# Patient Record
Sex: Female | Born: 1995
Health system: Southern US, Community
[De-identification: ages and names within clinical notes are randomized; demographics above are authoritative.]

## PROBLEM LIST (undated history)

## (undated) DIAGNOSIS — F419 Anxiety disorder, unspecified: Secondary | ICD-10-CM

## (undated) DIAGNOSIS — F32A Depression, unspecified: Secondary | ICD-10-CM

## (undated) HISTORY — DX: Anxiety disorder, unspecified: F41.9

## (undated) HISTORY — PX: WISDOM TOOTH EXTRACTION: SHX21

## (undated) HISTORY — PX: TONSILLECTOMY: SUR1361

## (undated) HISTORY — PX: FOOT SURGERY: SHX648

## (undated) HISTORY — DX: Depression, unspecified: F32.A

## (undated) HISTORY — PX: CYST REMOVAL: SHX22

---
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
## 2014-09-25 ENCOUNTER — Emergency Department (HOSPITAL_COMMUNITY)
Admission: EM | Admit: 2014-09-25 | Discharge: 2014-09-26 | Disposition: A | Discharge status: Home / Self Care | Payer: PRIVATE HEALTH INSURANCE | Attending: Emergency Medicine | Admitting: Emergency Medicine

## 2014-09-25 ENCOUNTER — Encounter (HOSPITAL_COMMUNITY): Payer: Self-pay

## 2014-09-25 DIAGNOSIS — R112 Nausea with vomiting, unspecified: Secondary | ICD-10-CM | POA: Diagnosis present

## 2014-09-25 DIAGNOSIS — R197 Diarrhea, unspecified: Secondary | ICD-10-CM | POA: Diagnosis not present

## 2014-09-25 DIAGNOSIS — Z3202 Encounter for pregnancy test, result negative: Secondary | ICD-10-CM | POA: Diagnosis not present

## 2014-09-25 DIAGNOSIS — Z79899 Other long term (current) drug therapy: Secondary | ICD-10-CM | POA: Insufficient documentation

## 2014-09-25 LAB — CBC WITH DIFFERENTIAL/PLATELET
Basophils Absolute: 0 10*3/uL (ref 0.0–0.1)
Basophils Relative: 0 % (ref 0–1)
Eosinophils Absolute: 0.2 10*3/uL (ref 0.0–0.7)
Eosinophils Relative: 1 % (ref 0–5)
HCT: 44.1 % (ref 36.0–46.0)
Hemoglobin: 14.9 g/dL (ref 12.0–15.0)
Lymphocytes Relative: 4 % — ABNORMAL LOW (ref 12–46)
Lymphs Abs: 0.5 10*3/uL — ABNORMAL LOW (ref 0.7–4.0)
MCH: 31.2 pg (ref 26.0–34.0)
MCHC: 33.8 g/dL (ref 30.0–36.0)
MCV: 92.3 fL (ref 78.0–100.0)
Monocytes Absolute: 0.7 10*3/uL (ref 0.1–1.0)
Monocytes Relative: 5 % (ref 3–12)
Neutro Abs: 11.3 10*3/uL — ABNORMAL HIGH (ref 1.7–7.7)
Neutrophils Relative %: 90 % — ABNORMAL HIGH (ref 43–77)
Platelets: 335 10*3/uL (ref 150–400)
RBC: 4.78 MIL/uL (ref 3.87–5.11)
RDW: 12.3 % (ref 11.5–15.5)
WBC: 12.6 10*3/uL — ABNORMAL HIGH (ref 4.0–10.5)

## 2014-09-25 MED ORDER — HYDROMORPHONE HCL 1 MG/ML IJ SOLN
0.5000 mg | Freq: Once | INTRAMUSCULAR | Status: AC
  Administered 2014-09-25: 0.5 mg via INTRAVENOUS
  Filled 2014-09-25: qty 1

## 2014-09-25 MED ORDER — ONDANSETRON HCL 4 MG/2ML IJ SOLN
4.0000 mg | Freq: Once | INTRAMUSCULAR | Status: AC
  Administered 2014-09-25: 4 mg via INTRAVENOUS
  Filled 2014-09-25: qty 2

## 2014-09-25 MED ORDER — SODIUM CHLORIDE 0.9 % IV BOLUS (SEPSIS)
2000.0000 mL | Freq: Once | INTRAVENOUS | Status: AC
  Administered 2014-09-25: 2000 mL via INTRAVENOUS

## 2014-09-25 MED ORDER — KETOROLAC TROMETHAMINE 15 MG/ML IJ SOLN
15.0000 mg | Freq: Once | INTRAMUSCULAR | Status: AC
  Administered 2014-09-25: 15 mg via INTRAVENOUS
  Filled 2014-09-25: qty 1

## 2014-09-25 NOTE — ED Notes (Signed)
Pt presents with c/o abdominal pain, vomiting, and diarrhea. Pt reports her stomach has been hurting since November and she has been following a doctor for this but has been unable to come out with a definitive diagnosis. Pt reports the vomiting and diarrhea started today. Pt also c/o headache.

## 2014-09-26 LAB — URINALYSIS, ROUTINE W REFLEX MICROSCOPIC
Bilirubin Urine: NEGATIVE
Glucose, UA: NEGATIVE mg/dL
Ketones, ur: NEGATIVE mg/dL
Leukocytes, UA: NEGATIVE
Nitrite: NEGATIVE
Protein, ur: NEGATIVE mg/dL
Specific Gravity, Urine: 1.022 (ref 1.005–1.030)
Urobilinogen, UA: 0.2 mg/dL (ref 0.0–1.0)
pH: 6 (ref 5.0–8.0)

## 2014-09-26 LAB — COMPREHENSIVE METABOLIC PANEL
ALT: 26 U/L (ref 0–35)
AST: 28 U/L (ref 0–37)
Albumin: 4.3 g/dL (ref 3.5–5.2)
Alkaline Phosphatase: 86 U/L (ref 39–117)
Anion gap: 11 (ref 5–15)
BUN: 15 mg/dL (ref 6–23)
CO2: 23 mmol/L (ref 19–32)
Calcium: 8.9 mg/dL (ref 8.4–10.5)
Chloride: 101 mmol/L (ref 96–112)
Creatinine, Ser: 0.77 mg/dL (ref 0.50–1.10)
GFR calc Af Amer: >90 mL/min (ref >90)
GFR calc non Af Amer: >90 mL/min (ref >90)
Glucose, Bld: 109 mg/dL — ABNORMAL HIGH (ref 70–99)
Potassium: 3.4 mmol/L — ABNORMAL LOW (ref 3.5–5.1)
Sodium: 135 mmol/L (ref 135–145)
Total Bilirubin: 0.4 mg/dL (ref 0.3–1.2)
Total Protein: 7.2 g/dL (ref 6.0–8.3)

## 2014-09-26 LAB — URINE MICROSCOPIC-ADD ON

## 2014-09-26 LAB — LIPASE, BLOOD: Lipase: 20 U/L (ref 11–59)

## 2014-09-26 LAB — PREGNANCY, URINE: Preg Test, Ur: NEGATIVE

## 2014-09-26 MED ORDER — ONDANSETRON HCL 4 MG PO TABS: 4.0000 mg | ORAL_TABLET | Freq: Four times a day (QID) | ORAL | Status: AC

## 2014-09-26 NOTE — ED Provider Notes (Signed)
Nursing notes and vitals signs, including pulse oximetry, reviewed.  Summary of this visit's results, reviewed by myself:  Labs:  Results for orders placed or performed during the hospital encounter of 09/25/14 (from the past 24 hour(s))  Comprehensive metabolic panel     Status: Abnormal   Collection Time: 09/25/14 10:13 PM  Result Value Ref Range   Sodium 135 135 - 145 mmol/L   Potassium 3.4 (L) 3.5 - 5.1 mmol/L   Chloride 101 96 - 112 mmol/L   CO2 23 19 - 32 mmol/L   Glucose, Bld 109 (H) 70 - 99 mg/dL   BUN 15 6 - 23 mg/dL   Creatinine, Ser 0.98 0.50 - 1.10 mg/dL   Calcium 8.9 8.4 - 11.9 mg/dL   Total Protein 7.2 6.0 - 8.3 g/dL   Albumin 4.3 3.5 - 5.2 g/dL   AST 28 0 - 37 U/L   ALT 26 0 - 35 U/L   Alkaline Phosphatase 86 39 - 117 U/L   Total Bilirubin 0.4 0.3 - 1.2 mg/dL   GFR calc non Af Amer >90 >90 mL/min   GFR calc Af Amer >90 >90 mL/min   Anion gap 11 5 - 15  Lipase, blood     Status: None   Collection Time: 09/25/14 10:13 PM  Result Value Ref Range   Lipase 20 11 - 59 U/L  CBC with Differential     Status: Abnormal   Collection Time: 09/25/14 10:13 PM  Result Value Ref Range   WBC 12.6 (H) 4.0 - 10.5 K/uL   RBC 4.78 3.87 - 5.11 MIL/uL   Hemoglobin 14.9 12.0 - 15.0 g/dL   HCT 14.7 82.9 - 56.2 %   MCV 92.3 78.0 - 100.0 fL   MCH 31.2 26.0 - 34.0 pg   MCHC 33.8 30.0 - 36.0 g/dL   RDW 13.0 86.5 - 78.4 %   Platelets 335 150 - 400 K/uL   Neutrophils Relative % 90 (H) 43 - 77 %   Neutro Abs 11.3 (H) 1.7 - 7.7 K/uL   Lymphocytes Relative 4 (L) 12 - 46 %   Lymphs Abs 0.5 (L) 0.7 - 4.0 K/uL   Monocytes Relative 5 3 - 12 %   Monocytes Absolute 0.7 0.1 - 1.0 K/uL   Eosinophils Relative 1 0 - 5 %   Eosinophils Absolute 0.2 0.0 - 0.7 K/uL   Basophils Relative 0 0 - 1 %   Basophils Absolute 0.0 0.0 - 0.1 K/uL  Urinalysis, Routine w reflex microscopic     Status: Abnormal   Collection Time: 09/25/14 11:17 PM  Result Value Ref Range   Color, Urine YELLOW YELLOW   APPearance CLEAR CLEAR   Specific Gravity, Urine 1.022 1.005 - 1.030   pH 6.0 5.0 - 8.0   Glucose, UA NEGATIVE NEGATIVE mg/dL   Hgb urine dipstick TRACE (A) NEGATIVE   Bilirubin Urine NEGATIVE NEGATIVE   Ketones, ur NEGATIVE NEGATIVE mg/dL   Protein, ur NEGATIVE NEGATIVE mg/dL   Urobilinogen, UA 0.2 0.0 - 1.0 mg/dL   Nitrite NEGATIVE NEGATIVE   Leukocytes, UA NEGATIVE NEGATIVE  Pregnancy, urine     Status: None   Collection Time: 09/25/14 11:17 PM  Result Value Ref Range   Preg Test, Ur NEGATIVE NEGATIVE  Urine microscopic-add on     Status: Abnormal   Collection Time: 09/25/14 11:17 PM  Result Value Ref Range   Squamous Epithelial / LPF FEW (A) RARE   WBC, UA 3-6 <3 WBC/hpf   RBC /  HPF 0-2 <3 RBC/hpf   Bacteria, UA FEW (A) RARE   Urine-Other MUCOUS PRESENT      Paula LibraJohn Theadore Blunck, MD 09/26/14 [phone]

## 2014-09-26 NOTE — ED Notes (Signed)
AVS explained in detail. Highly advised to follow up with GI MD. No other c/c.

## 2014-09-26 NOTE — ED Provider Notes (Signed)
CSN: [id number]     Arrival date & time 09/25/14  2122 History   First MD Initiated Contact with Patient 09/25/14 2147     Chief Complaint  Patient presents with  . Abdominal Pain  . Emesis  . Diarrhea     (Consider location/radiation/quality/duration/timing/severity/associated sxs/prior Treatment) HPI   19 year old female with abdominal pain. Additional history provided by parents at bedside. Patient reports ongoing abdominal pain since this past November. Intermittent crampy pain and bloating. Often constipated. Previous evaluation by gastroenterology without a clear etiology. Currently taking levsin. She is presenting today because of similar type pain but also now nausea, vomiting diarrhea. No fevers or chills. Nonbloody stool in emesis. No urinary complaints. No sick contacts. No similar past abdominal or pelvic surgical history. Unusual vaginal bleeding or discharge. She does not think she is pregnant.   History reviewed. No pertinent past medical history. Past Surgical History  Procedure Laterality Date  . Foot surgery     Family History  Problem Relation Age of Onset  . Cancer Paternal Grandmother     Brain  . Cancer Paternal Grandfather     Colon   History  Substance Use Topics  . Smoking status: Never Smoker   . Smokeless tobacco: Never Used  . Alcohol Use: No   OB History    No data available     Review of Systems  All systems reviewed and negative, other than as noted in HPI.   Allergies  Review of patient's allergies indicates no known allergies.  Home Medications   Prior to Admission medications   Medication Sig Start Date End Date Taking? Authorizing Provider  busPIRone (BUSPAR) 15 MG tablet Take 15 mg by mouth 2 (two) times daily.  07/01/14  Yes Historical Provider, MD  fluvoxaMINE (LUVOX) 50 MG tablet Take 50-75 mg by mouth 2 (two) times daily. 50 in the ma and 75 mg qhs 09/03/14  Yes Historical Provider, MD  hyoscyamine (LEVSIN SL) 0.125 MG SL  tablet Take 0.125 mg by mouth every 6 (six) hours as needed for cramping.  07/08/14  Yes Historical Provider, MD  ibuprofen (ADVIL,MOTRIN) 200 MG tablet Take 600 mg by mouth every 6 (six) hours as needed for moderate pain.   Yes Historical Provider, MD   BP 103/62 mmHg  Pulse 107  Temp(Src) 98.8 F (37.1 C) (Oral)  Resp 13  Ht  (1.651 m)  Wt 140 lb (63.504 kg)  BMI 23.30 kg/m2  SpO2 98%  LMP 08/31/2014 Physical Exam  Constitutional: She appears well-developed and well-nourished. No distress.  HENT:  Head: Normocephalic and atraumatic.  Eyes: Conjunctivae are normal. Right eye exhibits no discharge. Left eye exhibits no discharge.  Neck: Neck supple.  Cardiovascular: Normal rate, regular rhythm and normal heart sounds.  Exam reveals no gallop and no friction rub.   No murmur heard. Pulmonary/Chest: Effort normal and breath sounds normal. No respiratory distress.  Abdominal: Soft. She exhibits no distension. There is no tenderness.  Musculoskeletal: She exhibits no edema or tenderness.  Neurological: She is alert.  Skin: Skin is warm and dry.  Psychiatric: She has a normal mood and affect. Her behavior is normal. Thought content normal.  Nursing note and vitals reviewed.   ED Course  Procedures (including critical care time) Labs Review Labs Reviewed  CBC WITH DIFFERENTIAL/PLATELET - Abnormal; Notable for the following:    WBC 12.6 (*)    Neutrophils Relative % 90 (*)    Neutro Abs 11.3 (*)    Lymphocytes  Relative 4 (*)    Lymphs Abs 0.5 (*)    All other components within normal limits  PREGNANCY, URINE  COMPREHENSIVE METABOLIC PANEL  LIPASE, BLOOD  URINALYSIS, ROUTINE W REFLEX MICROSCOPIC    Imaging Review No results found.   EKG Interpretation None      MDM   Final diagnoses:  Nausea vomiting and diarrhea    19 year old female with abdominal pain, nausea, vomiting and diarrhea. Suspect for gastroenteritis. Abdominal exam is fairly benign. Patient family  concerned about ongoing abdominal symptoms. Explained that I realistically cannot give exact diagnosis in the emergency room. She presented tachycardic in the 120s. IV was placed. Fluids. Symptomatically treatment. Basic labs and urinalysis.  Patient rechecked. No further complaints. She reports that she feels significantly improved. Heart rate has decreased. Labs still pending at this time.    Raeford RazorStephen Latishia Suitt, MD 09/28/14 [phone]

## 2016-05-21 ENCOUNTER — Emergency Department (HOSPITAL_BASED_OUTPATIENT_CLINIC_OR_DEPARTMENT_OTHER): Payer: PRIVATE HEALTH INSURANCE

## 2016-05-21 ENCOUNTER — Emergency Department (HOSPITAL_BASED_OUTPATIENT_CLINIC_OR_DEPARTMENT_OTHER)
Admission: EM | Admit: 2016-05-21 | Discharge: 2016-05-21 | Disposition: A | Discharge status: Home / Self Care | Payer: PRIVATE HEALTH INSURANCE | Attending: Emergency Medicine | Admitting: Emergency Medicine

## 2016-05-21 ENCOUNTER — Encounter (HOSPITAL_BASED_OUTPATIENT_CLINIC_OR_DEPARTMENT_OTHER): Payer: Self-pay | Admitting: *Deleted

## 2016-05-21 DIAGNOSIS — R197 Diarrhea, unspecified: Secondary | ICD-10-CM | POA: Insufficient documentation

## 2016-05-21 DIAGNOSIS — R1011 Right upper quadrant pain: Secondary | ICD-10-CM

## 2016-05-21 DIAGNOSIS — Z791 Long term (current) use of non-steroidal anti-inflammatories (NSAID): Secondary | ICD-10-CM | POA: Diagnosis not present

## 2016-05-21 DIAGNOSIS — Z79899 Other long term (current) drug therapy: Secondary | ICD-10-CM | POA: Diagnosis not present

## 2016-05-21 DIAGNOSIS — R112 Nausea with vomiting, unspecified: Secondary | ICD-10-CM

## 2016-05-21 DIAGNOSIS — R109 Unspecified abdominal pain: Secondary | ICD-10-CM

## 2016-05-21 LAB — COMPREHENSIVE METABOLIC PANEL
ALBUMIN: 4.5 g/dL (ref 3.5–5.0)
ALK PHOS: 74 U/L (ref 38–126)
ALT: 17 U/L (ref 14–54)
AST: 23 U/L (ref 15–41)
Anion gap: 9 (ref 5–15)
BILIRUBIN TOTAL: 0.4 mg/dL (ref 0.3–1.2)
BUN: 14 mg/dL (ref 6–20)
CO2: 24 mmol/L (ref 22–32)
Calcium: 9.3 mg/dL (ref 8.9–10.3)
Chloride: 105 mmol/L (ref 101–111)
Creatinine, Ser: 0.64 mg/dL (ref 0.44–1.00)
GFR calc Af Amer: >60 mL/min (ref >60)
GFR calc non Af Amer: >60 mL/min (ref >60)
GLUCOSE: 86 mg/dL (ref 65–99)
POTASSIUM: 4.2 mmol/L (ref 3.5–5.1)
Sodium: 138 mmol/L (ref 135–145)
TOTAL PROTEIN: 7.3 g/dL (ref 6.5–8.1)

## 2016-05-21 LAB — LIPASE, BLOOD: Lipase: 17 U/L (ref 11–51)

## 2016-05-21 LAB — URINALYSIS, ROUTINE W REFLEX MICROSCOPIC
Bilirubin Urine: NEGATIVE
GLUCOSE, UA: NEGATIVE mg/dL
Ketones, ur: 15 mg/dL — AB
Leukocytes, UA: NEGATIVE
Nitrite: NEGATIVE
PH: 5.5 (ref 5.0–8.0)
PROTEIN: NEGATIVE mg/dL
Specific Gravity, Urine: 1.019 (ref 1.005–1.030)

## 2016-05-21 LAB — CBC WITH DIFFERENTIAL/PLATELET
BASOS ABS: 0 10*3/uL (ref 0.0–0.1)
BASOS PCT: 0 %
Eosinophils Absolute: 0.1 10*3/uL (ref 0.0–0.7)
Eosinophils Relative: 2 %
HEMATOCRIT: 42.8 % (ref 36.0–46.0)
HEMOGLOBIN: 14.6 g/dL (ref 12.0–15.0)
Lymphocytes Relative: 20 %
Lymphs Abs: 1.2 10*3/uL (ref 0.7–4.0)
MCH: 31.8 pg (ref 26.0–34.0)
MCHC: 34.1 g/dL (ref 30.0–36.0)
MCV: 93.2 fL (ref 78.0–100.0)
Monocytes Absolute: 0.7 10*3/uL (ref 0.1–1.0)
Monocytes Relative: 11 %
NEUTROS ABS: 4.1 10*3/uL (ref 1.7–7.7)
NEUTROS PCT: 67 %
Platelets: 312 10*3/uL (ref 150–400)
RBC: 4.59 MIL/uL (ref 3.87–5.11)
RDW: 12.2 % (ref 11.5–15.5)
WBC: 6.1 10*3/uL (ref 4.0–10.5)

## 2016-05-21 LAB — URINE MICROSCOPIC-ADD ON

## 2016-05-21 LAB — PREGNANCY, URINE: Preg Test, Ur: NEGATIVE

## 2016-05-21 MED ORDER — ONDANSETRON HCL 4 MG/2ML IJ SOLN
4.0000 mg | Freq: Once | INTRAMUSCULAR | Status: AC
  Administered 2016-05-21: 4 mg via INTRAVENOUS
  Filled 2016-05-21: qty 2

## 2016-05-21 MED ORDER — MORPHINE SULFATE (PF) 4 MG/ML IV SOLN
4.0000 mg | Freq: Once | INTRAVENOUS | Status: AC
  Administered 2016-05-21: 4 mg via INTRAVENOUS
  Filled 2016-05-21: qty 1

## 2016-05-21 MED ORDER — SODIUM CHLORIDE 0.9 % IV BOLUS (SEPSIS)
1000.0000 mL | Freq: Once | INTRAVENOUS | Status: AC
  Administered 2016-05-21: 1000 mL via INTRAVENOUS

## 2016-05-21 MED ORDER — ONDANSETRON 4 MG PO TBDP: ORAL_TABLET | ORAL | 0 refills | Status: AC

## 2016-05-21 MED ORDER — DICYCLOMINE HCL 20 MG PO TABS: 20.0000 mg | ORAL_TABLET | Freq: Three times a day (TID) | ORAL | 0 refills | Status: AC | PRN

## 2016-05-21 MED FILL — ONDANSETRON ODT 4 MG TABLET: 4 | 2 days supply | Qty: 9 | Fill #0

## 2016-05-21 MED FILL — DICYCLOMINE 20 MG TABLET: 20 | 7 days supply | Qty: 20 | Fill #0

## 2016-05-21 NOTE — ED Triage Notes (Signed)
Vomiting x 3 days. Abdominal pain and diarrhea. She went to [facility] walk in today and was told to come here for further evaluation and dehydration treatment.

## 2016-05-21 NOTE — ED Notes (Signed)
ED Provider at bedside. 

## 2016-05-21 NOTE — ED Provider Notes (Signed)
[facility]   CSN: [id number] Arrival date & time: 05/21/16  1310     History   Chief Complaint Chief Complaint  Patient presents with  . Emesis  . Abdominal Pain    HPI Brandy Byrd is a 20 y.o. female who presented with vomiting, diarrhea, abdominal pain. Patient states that she has been vomiting for the last 3 days. Also has several episodes of watery diarrhea. Denies any urinary symptoms. Patient has more right upper quadrant pain since yesterday and right flank pain. Patient states that she has a history of kidney stones with similar complaints previously. Denies recent travel or uncooked meat. Patient was sent from urgent care for dehydration for further imaging.   The history is provided by the patient.    History reviewed. No pertinent past medical history.  There are no active problems to display for this patient.   Past Surgical History:  Procedure Laterality Date  . FOOT SURGERY    . WISDOM TOOTH EXTRACTION      OB History    No data available       Home Medications    Prior to Admission medications   Medication Sig Start Date End Date Taking? Authorizing Provider  fluvoxaMINE (LUVOX) 50 MG tablet Take 50-75 mg by mouth 2 (two) times daily. 50 in the ma and 75 mg qhs 09/03/14  Yes Historical Provider, MD  ibuprofen (ADVIL,MOTRIN) 200 MG tablet Take 600 mg by mouth every 6 (six) hours as needed for moderate pain.   Yes Historical Provider, MD  busPIRone (BUSPAR) 15 MG tablet Take 15 mg by mouth 2 (two) times daily.  07/01/14   Historical Provider, MD  hyoscyamine (LEVSIN SL) 0.125 MG SL tablet Take 0.125 mg by mouth every 6 (six) hours as needed for cramping.  07/08/14   Historical Provider, MD  ondansetron (ZOFRAN) 4 MG tablet Take 1 tablet (4 mg total) by mouth every 6 (six) hours. 09/26/14   Raeford RazorStephen Kohut, MD    Family History Family History  Problem Relation Age of Onset  . Cancer Paternal Grandmother     Brain  . Cancer  Paternal Grandfather     Colon    Social History Social History  Substance Use Topics  . Smoking status: Never Smoker  . Smokeless tobacco: Never Used  . Alcohol use No     Allergies   Patient has no known allergies.   Review of Systems Review of Systems  Gastrointestinal: Positive for abdominal pain, diarrhea, nausea and vomiting.  All other systems reviewed and are negative.    Physical Exam Updated Vital Signs BP 117/78   Pulse 78   Temp 97.4 F (36.3 C) (Oral)   Resp 18   Ht 5\' 5"  (1.651 m)   Wt 135 lb (61.2 kg)   LMP 04/04/2016 Comment: menses lasted the month of October  SpO2 100%   BMI 22.47 kg/m   Physical Exam  Constitutional: She is oriented to person, place, and time.  Uncomfortable   HENT:  Head: Normocephalic.  MM slightly dry   Eyes: EOM are normal. Pupils are equal, round, and reactive to light.  Neck: Normal range of motion. Neck supple.  Cardiovascular: Normal rate, regular rhythm and normal heart sounds.   Pulmonary/Chest: Effort normal and breath sounds normal. No respiratory distress. She has no wheezes.  Abdominal: Soft. Bowel sounds are normal.  Mild RUQ tenderness, no murphy's. Mild R CVAT   Musculoskeletal: Normal range of motion.  Neurological: She  is alert and oriented to person, place, and time. No cranial nerve deficit. Coordination normal.  Skin: Skin is warm.  Psychiatric: She has a normal mood and affect.  Nursing note and vitals reviewed.    ED Treatments / Results  Labs (all labs ordered are listed, but only abnormal results are displayed) Labs Reviewed  URINALYSIS, ROUTINE W REFLEX MICROSCOPIC (NOT AT [facility]) - Abnormal; Notable for the following:       Result Value   Hgb urine dipstick SMALL (*)    Ketones, ur 15 (*)    All other components within normal limits  URINE MICROSCOPIC-ADD ON - Abnormal; Notable for the following:    Squamous Epithelial / LPF 0-5 (*)    Bacteria, UA RARE (*)    All other components  within normal limits  CBC WITH DIFFERENTIAL/PLATELET  COMPREHENSIVE METABOLIC PANEL  LIPASE, BLOOD  PREGNANCY, URINE    EKG  EKG Interpretation None       Radiology [place] Abdomen Complete  Result Date: 05/21/2016 CLINICAL DATA:  Three days of right flank pain associated with nausea vomiting and diarrhea. EXAM: ABDOMEN ULTRASOUND COMPLETE COMPARISON:  None in PACs FINDINGS: Gallbladder: No gallstones or wall thickening visualized. No sonographic Murphy sign noted by sonographer. Common bile duct: Diameter: 3.8 mm Liver: No focal lesion identified. Within normal limits in parenchymal echogenicity. IVC: No abnormality visualized. Pancreas: The pancreatic head and body appear normal. The pancreatic tail was partially obscured by bowel gas. The pancreatic duct measures 1.7 mm in diameter. Spleen: Size and appearance within normal limits. Right Kidney: Length: 9.5 cm. Echogenicity within normal limits. No mass or hydronephrosis visualized. No echogenic shadowing stones are observed. Left Kidney: Length: 10 cm. Echogenicity within normal limits. No mass or hydronephrosis visualized. No echogenic shadowing stones are observed. Abdominal aorta: No aneurysm visualized. Other findings: There is no ascites. IMPRESSION: 1. No gallstones or sonographic evidence of acute cholecystitis. If there are clinical concerns of chronic cholecystitis, a nuclear medicine hepatobiliary scan with gallbladder ejection fraction determination may be useful. 2. No kidney stones nor other acute intra-abdominal abnormality is observed. Electronically Signed   By: Thadeus Gandolfi  [place] M.D.   On: 05/21/2016 15:13    Procedures Procedures (including critical care time)  Medications Ordered in ED Medications  sodium chloride 0.9 % bolus 1,000 mL (0 mLs Intravenous Stopped 05/21/16 1510)  morphine 4 MG/ML injection 4 mg (4 mg Intravenous Given 05/21/16 1419)  ondansetron (ZOFRAN) injection 4 mg (4 mg Intravenous Given 05/21/16 1419)      Initial Impression / Assessment and Plan / ED Course  I have reviewed the triage vital signs and the nursing notes.  Pertinent labs & imaging results that were available during my care of the patient were reviewed by me and considered in my medical decision making (see chart for details).  Clinical Course     Brandy Byrd is a 20 y.o. female here with vomiting, diarrhea, RUQ pain. Likely gastro vs biliary colic vs renal colic. Will get labs, lipase, ab US.   3:33 PM Labs unremarkable. UA showed 15 ketones. US showed no acute chole, no hydro. Given IVF and zofran. Tolerated PO fluids. Likely gastro. Will dc home with zofran, bentyl for cramps, imodium.   Final Clinical Impressions(s) / ED Diagnoses   Final diagnoses:  RUQ pain    New Prescriptions New Prescriptions   No medications on file     Charlynne Panderavid Hsienta Victorya Hillman, MD 05/21/16 1536

## 2016-05-21 NOTE — Discharge Instructions (Signed)
Stay hydrated.   Take zofran for nausea.   Take bentyl for cramps.   Take imodium up to 10 times daily for diarrhea.   See your doctor.   Return to ER if you have fevers, severe abdominal pain, vomiting, worse cramps, dehydration.

## 2016-05-21 NOTE — ED Notes (Signed)
Patient transported to Ultrasound 

## 2016-06-14 ENCOUNTER — Other Ambulatory Visit: Payer: Self-pay | Admitting: Otolaryngology

## 2016-06-19 ENCOUNTER — Encounter (HOSPITAL_BASED_OUTPATIENT_CLINIC_OR_DEPARTMENT_OTHER): Payer: Self-pay

## 2016-06-19 ENCOUNTER — Ambulatory Visit (HOSPITAL_BASED_OUTPATIENT_CLINIC_OR_DEPARTMENT_OTHER): Admit: 2016-06-19 | Payer: PRIVATE HEALTH INSURANCE | Admitting: Otolaryngology

## 2016-06-19 SURGERY — TONSILLECTOMY AND ADENOIDECTOMY
Anesthesia: General | Laterality: Bilateral

## 2017-06-17 ENCOUNTER — Ambulatory Visit (INDEPENDENT_AMBULATORY_CARE_PROVIDER_SITE_OTHER): Payer: 59 | Admitting: Neurology

## 2017-06-17 ENCOUNTER — Encounter: Payer: Self-pay | Admitting: Neurology

## 2017-06-17 VITALS — BP 140/60 | HR 101 | Ht 65.0 in | Wt 148.0 lb

## 2017-06-17 DIAGNOSIS — R202 Paresthesia of skin: Secondary | ICD-10-CM | POA: Diagnosis not present

## 2017-06-17 MED ORDER — ALPRAZOLAM 0.5 MG PO TABS: ORAL_TABLET | ORAL | 0 refills | Status: AC

## 2017-06-17 MED ORDER — IBUPROFEN 600 MG PO TABS: 600.0000 mg | ORAL_TABLET | Freq: Three times a day (TID) | ORAL | 1 refills | Status: AC

## 2017-06-17 MED ORDER — GABAPENTIN 100 MG PO CAPS: 100.0000 mg | ORAL_CAPSULE | Freq: Three times a day (TID) | ORAL | 3 refills | Status: DC

## 2017-06-17 NOTE — Progress Notes (Signed)
Faxed printed/signed rx alprazolam to [facility] at [phone]. Received fax confirmation.

## 2017-06-17 NOTE — Progress Notes (Signed)
Reason for visit: Neck and shoulder pain, arm paresthesias  Brandy Byrd is a 21 y.o. female  History of present illness:  Brandy Byrd is a 21 year old left-handed white female with a history of some neck and shoulder discomfort beginning around 8 or 9 years ago.  The patient underwent MRI evaluation of the cervical and thoracic spine in 2012, but she had braces in at the time and there was significant metal artifact associated with the study.  The patient was found to have a mild Arnold-Chiari malformation, no spinal cord or lower brainstem compression was noted.  The patient has done fairly well over time, but within the last month or so, she has developed increased discomfort in the neck and shoulder area, and she will have alternating paresthesias down both arms and achy sensations into the triceps area and forearm bilaterally.  The patient does not believe there is any true weakness of the upper extremities, at times the arms feel normal, at other times the paresthesias and pain ensues.  The neck and shoulder area always hurts.  The patient denies any balance problems or difficulty controlling the bowels of the bladder.  She denies any paresthesias into the legs.  She denies any increased symptoms down the arms with any particular head movement.  She comes to this office on an urgent basis for an evaluation.  At times, she may have some dizziness when she stands up quickly.  Occasionally she may have headaches, she oftentimes will wake up with a headache in the morning.  Past Medical History:  Diagnosis Date  . Anxiety     Past Surgical History:  Procedure Laterality Date  . FOOT SURGERY    . TONSILLECTOMY    . WISDOM TOOTH EXTRACTION      Family History  Problem Relation Age of Onset  . Cancer Paternal Grandmother        Brain  . Cancer Paternal Grandfather        Colon    Social history:  reports that  has never smoked. she has never used smokeless tobacco. She reports that  she does not drink alcohol or use drugs.  Medications:  Prior to Admission medications   Medication Sig Start Date End Date Taking? Authorizing Provider  busPIRone (BUSPAR) 15 MG tablet Take 15 mg by mouth 2 (two) times daily.  07/01/14   [provider]  dicyclomine (BENTYL) 20 MG tablet Take 1 tablet (20 mg total) by mouth 3 (three) times daily as needed for spasms. 05/21/16   Charlynne PanderYao, David Hsienta, MD  fluvoxaMINE (LUVOX) 50 MG tablet Take 50-75 mg by mouth 2 (two) times daily. 50 in the ma and 75 mg qhs 09/03/14   [provider]  hyoscyamine (LEVSIN SL) 0.125 MG SL tablet Take 0.125 mg by mouth every 6 (six) hours as needed for cramping.  07/08/14   [provider]  ibuprofen (ADVIL,MOTRIN) 200 MG tablet Take 600 mg by mouth every 6 (six) hours as needed for moderate pain.    [provider]  ondansetron (ZOFRAN ODT) 4 MG disintegrating tablet 4mg  ODT q4 hours prn nausea/vomit 05/21/16   Charlynne PanderYao, David Hsienta, MD  ondansetron (ZOFRAN) 4 MG tablet Take 1 tablet (4 mg total) by mouth every 6 (six) hours. 09/26/14   Raeford RazorKohut, Stephen, MD     No Known Allergies  ROS:  Out of a complete 14 system review of symptoms, the patient complains only of the following symptoms, and all other reviewed systems are  negative.  Muscle cramps, aching muscles Numbness, dizziness  Blood pressure 140/60, pulse (!) 101, height 5\' 5"  (1.651 m), weight 148 lb (67.1 kg), SpO2 98 %.  Physical Exam  General: The patient is alert and cooperative at the time of the examination.  Eyes: Pupils are equal, round, and reactive to light. Discs are flat bilaterally.  Good venous pulsations are seen bilaterally.  Neck: The neck is supple, no carotid bruits are noted.  Respiratory: The respiratory examination is clear.  Cardiovascular: The cardiovascular examination reveals a regular rate and rhythm, no obvious murmurs or rubs are noted.  Neuromuscular: Range of movement the cervical spine is  full.  Skin: Extremities are without significant edema.  Neurologic Exam  Mental status: The patient is alert and oriented x 3 at the time of the examination. The patient has apparent normal recent and remote memory, with an apparently normal attention span and concentration ability.  Cranial nerves: Facial symmetry is present. There is good sensation of the face to pinprick and soft touch bilaterally. The strength of the facial muscles and the muscles to head turning and shoulder shrug are normal bilaterally. Speech is well enunciated, no aphasia or dysarthria is noted. Extraocular movements are full. Visual fields are full. The tongue is midline, and the patient has symmetric elevation of the soft palate. No obvious hearing deficits are noted.  Motor: The motor testing reveals 5 over 5 strength of all 4 extremities. Good symmetric motor tone is noted throughout.  Sensory: Sensory testing is intact to pinprick, soft touch, vibration sensation, and position sense on all 4 extremities. No evidence of extinction is noted.  Coordination: Cerebellar testing reveals good finger-nose-finger and heel-to-shin bilaterally.  Gait and station: Gait is normal. Tandem gait is normal. Romberg is negative. No drift is seen.  Reflexes: Deep tendon reflexes are symmetric and normal bilaterally. Toes are downgoing bilaterally.   Assessment/Plan:  1.  Neck and shoulder discomfort, paresthesias in the arms  2.  History of Arnold-Chiari type I  The patient has had worsening of discomfort in the neck and shoulders, she has begun having new symptoms of discomfort and paresthesias down both arms.  Clinical examination does not show reflex asymmetry or weakness in the extremities.  There are no sensory changes noted.  The patient does not appear to have stiffness in the neck that would suggest muscle spasm.  Prior MRI of the cervical spine done in 2012 was of poor quality due to metal artifact.  She will have MRI  of the cervical spine done.  The patient will be placed on low-dose gabapentin taking 100 mg 3 times daily, she will be placed on ibuprofen 600 mg 3 times daily.  The patient will contact me for any dose adjustments.  Marlan Palau. Keith Willis MD 06/17/2017 12:14 PM  [facility] [address] [place], [place] [id number]  Phone [phone] Fax [phone]

## 2017-06-26 ENCOUNTER — Other Ambulatory Visit: Payer: Self-pay | Admitting: Neurology

## 2017-06-26 MED ORDER — GABAPENTIN 300 MG PO CAPS: 300.0000 mg | ORAL_CAPSULE | Freq: Three times a day (TID) | ORAL | 3 refills | Status: AC

## 2017-07-02 HISTORY — PX: PR TONSILLECTOMY ONE-HALF <AGE 12: 42825

## 2017-07-09 ENCOUNTER — Telehealth: Payer: Self-pay | Admitting: Neurology

## 2017-07-09 NOTE — Telephone Encounter (Signed)
Pts mother called to let us know pt will be leaving for school on 07/13/17 and is wondering if there is anyway possible that Dr Anne HahnWillis will be able to discuss the MRI results with them before she leaves. Pts MRI is tomorrow 07/10/17

## 2017-07-09 NOTE — Telephone Encounter (Signed)
I called and left a message, I will contact them soon as I get the results of the MRI, I will discuss the results over the phone.

## 2017-07-10 ENCOUNTER — Ambulatory Visit
Admission: RE | Admit: 2017-07-10 | Discharge: 2017-07-10 | Disposition: A | Discharge status: Home / Self Care | Payer: 59 | Source: Ambulatory Visit | Attending: Neurology | Admitting: Neurology

## 2017-07-10 DIAGNOSIS — R202 Paresthesia of skin: Secondary | ICD-10-CM

## 2017-07-11 ENCOUNTER — Telehealth: Payer: Self-pay | Admitting: Neurology

## 2017-07-11 NOTE — Telephone Encounter (Signed)
I called about the MRI of the cervical spine.  The MRI is normal, but the sensory complaints noted are likely related to muscle tension in the neck and shoulders, gabapentin seem to be helping some, massage therapy and stretching may also result in some benefit.  MRI cervical 07/11/17:  IMPRESSION:   Unremarkable MRI cervical spine (without). No spinal stenosis or foraminal narrowing.  No intrinsic or compressive spinal cord lesions.

## 2017-07-28 IMAGING — US US ABDOMEN COMPLETE
1 series · 13 of 25 positions shown · non-contrast
Comparison: None in PACs

CLINICAL DATA: Three days of right flank pain associated with
nausea vomiting and diarrhea.

EXAM:
ABDOMEN ULTRASOUND COMPLETE

[Series 1: us abdomen complete · 0.18mm/px · 13 of 69 slices shown]
[im 1/69]
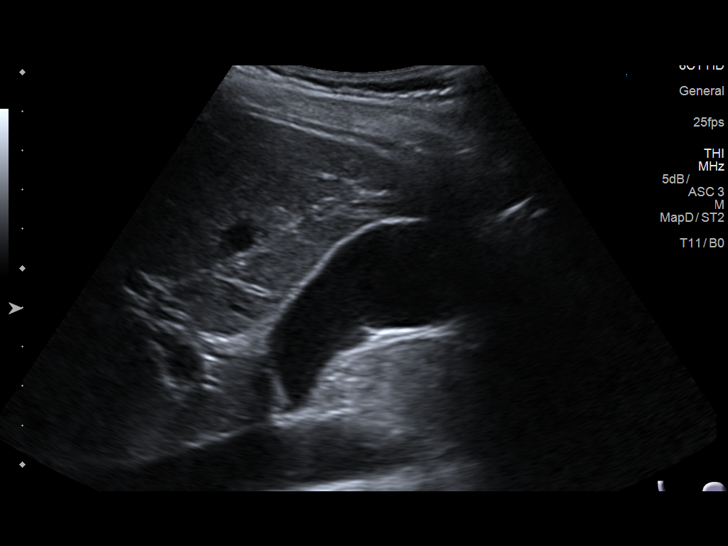
[im 6/69]
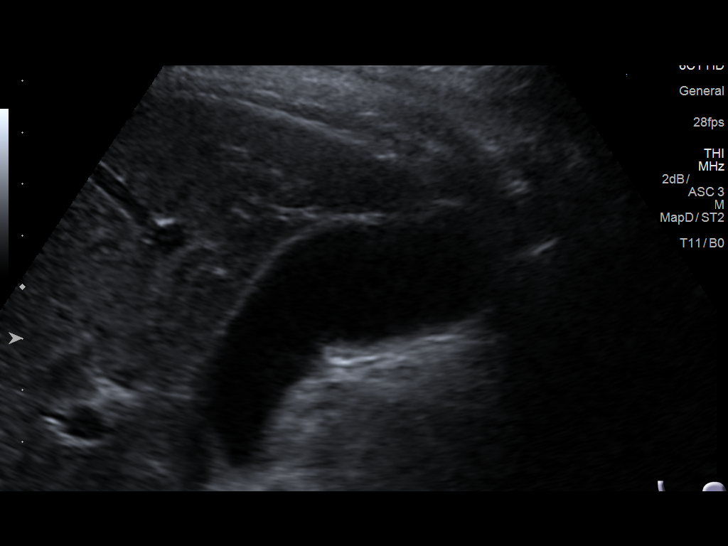
[im 12/69]
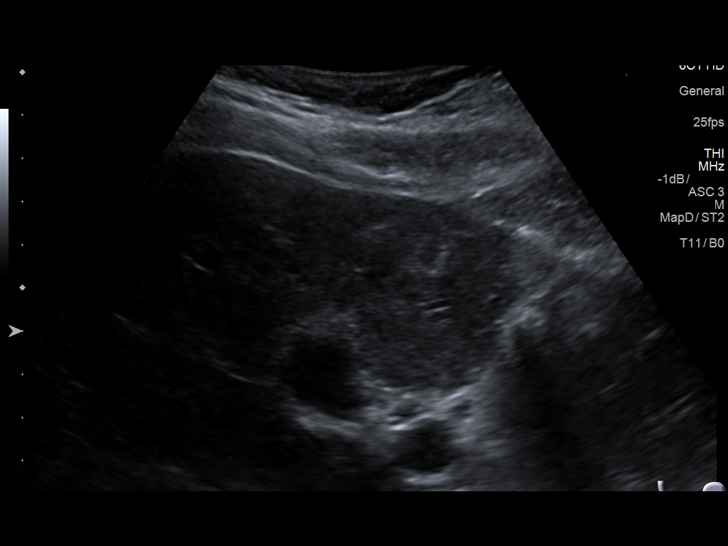
[im 18/69]
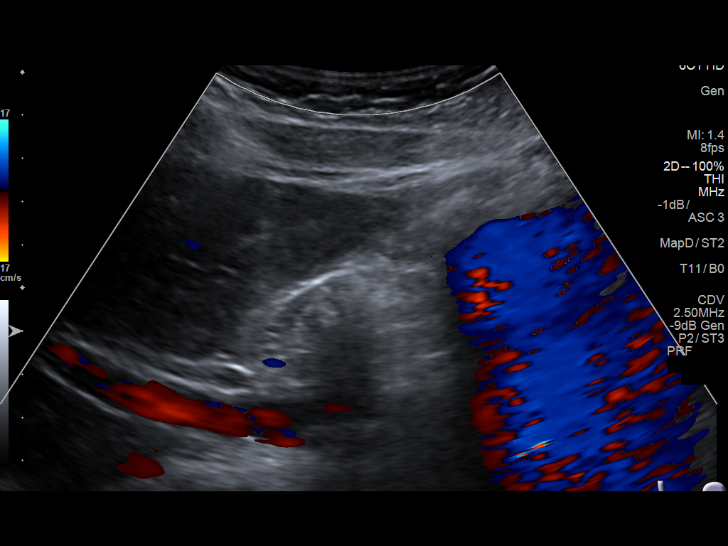
[im 23/69]
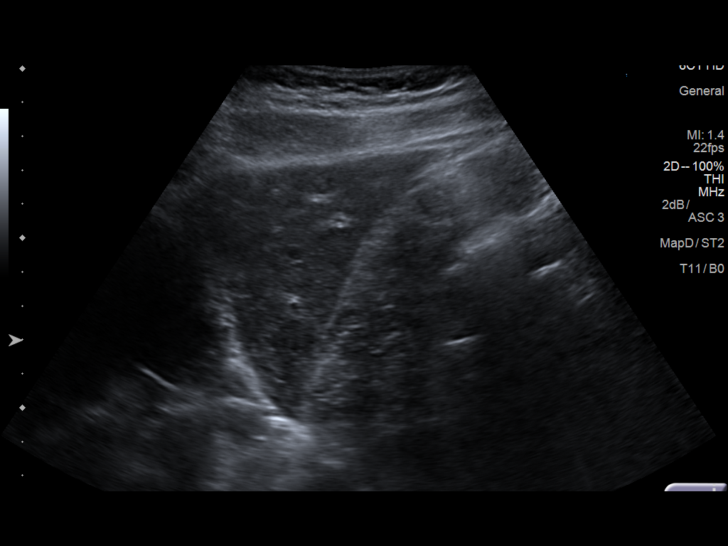
[im 29/69]
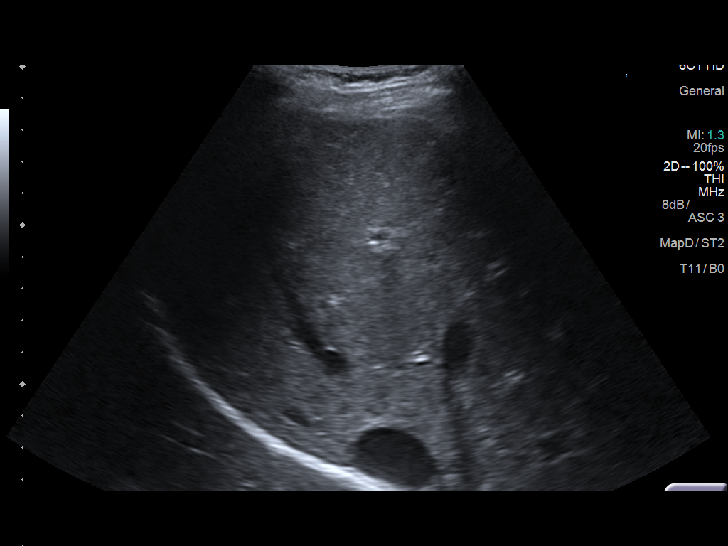
[im 35/69]
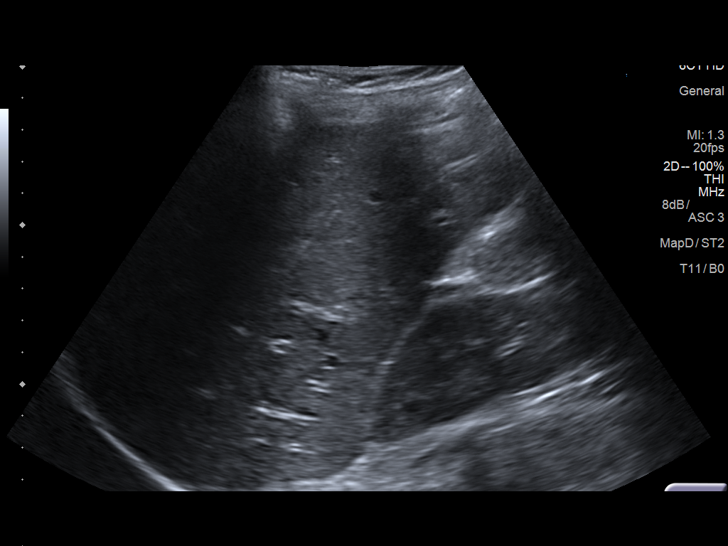
[im 40/69]
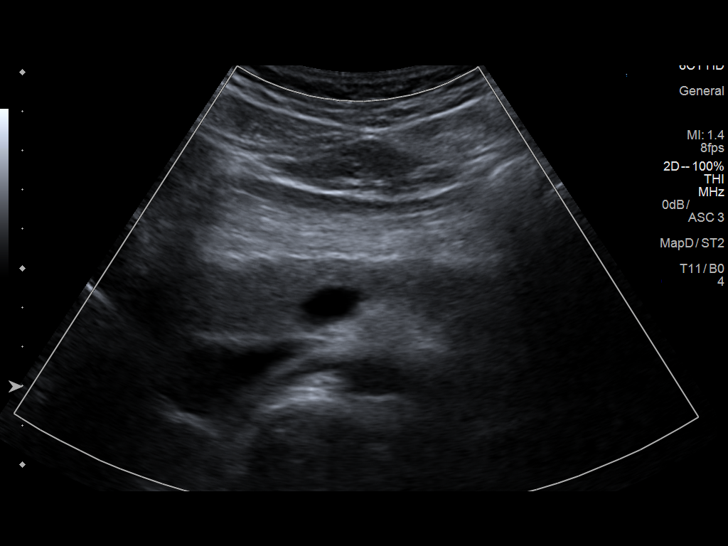
[im 46/69]
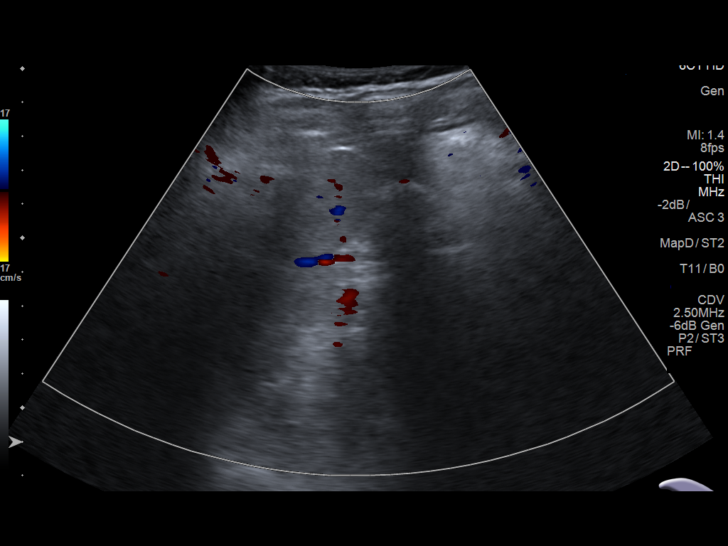
[im 52/69]
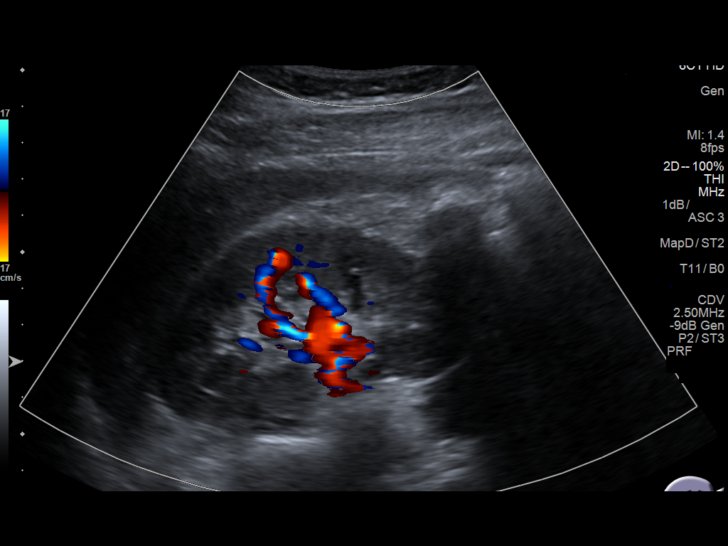
[im 57/69]
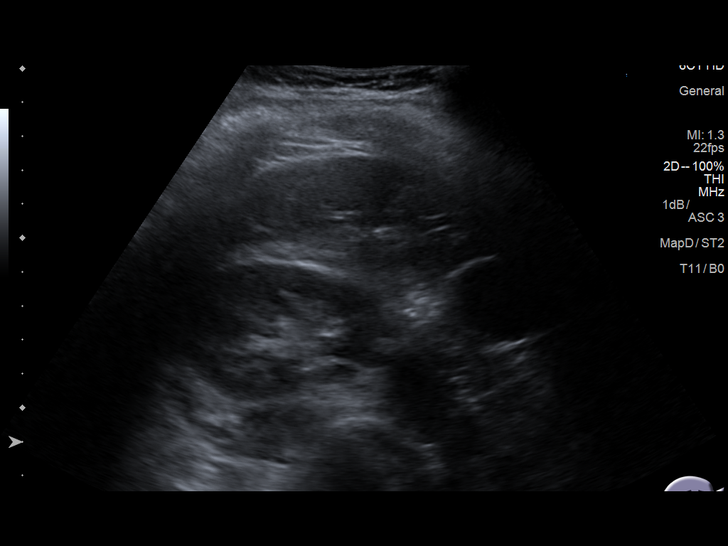
[im 63/69]
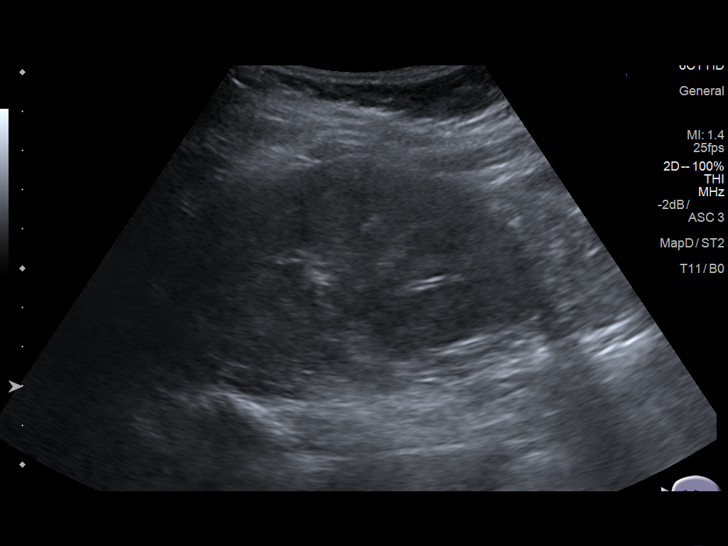
[im 69/69]
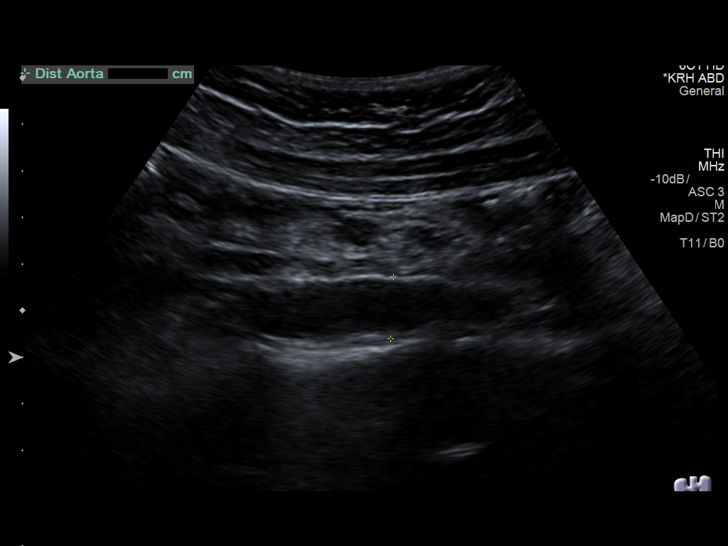

[13 of 25 positions shown; findings below may reference images not displayed]

FINDINGS: Gallbladder: No gallstones or wall thickening visualized. No
sonographic Murphy sign noted by sonographer.

Common bile duct: Diameter: 3.8 mm

Liver: No focal lesion identified. Within normal limits in
parenchymal echogenicity.

IVC: No abnormality visualized.

Pancreas: The pancreatic head and body appear normal. The pancreatic
tail was partially obscured by bowel gas. The pancreatic duct
measures 1.7 mm in diameter.

Spleen: Size and appearance within normal limits.

Right Kidney: Length: 9.5 cm. Echogenicity within normal limits. No
mass or hydronephrosis visualized. No echogenic shadowing stones are
observed.

Left Kidney: Length: 10 cm. Echogenicity within normal limits. No
mass or hydronephrosis visualized. No echogenic shadowing stones are
observed.

Abdominal aorta: No aneurysm visualized.

Other findings: There is no ascites.
IMPRESSION: 1. No gallstones or sonographic evidence of acute cholecystitis. If
there are clinical concerns of chronic cholecystitis, a nuclear
medicine hepatobiliary scan with gallbladder ejection fraction
determination may be useful.
2. No kidney stones nor other acute intra-abdominal abnormality is
observed.

## 2018-01-21 ENCOUNTER — Ambulatory Visit: Admit: 2018-01-21 | Discharge: 2018-01-22 | Payer: PRIVATE HEALTH INSURANCE | Attending: FNP

## 2018-01-21 DIAGNOSIS — J329 Chronic sinusitis, unspecified: Secondary | ICD-10-CM

## 2018-01-21 NOTE — Discharge Instructions (Signed)
Patient Education     Bacterial Conjunctivitis    Bacterial conjunctivitis is an infection of the clear membrane that covers the white part of your eye and the inner surface of your eyelid (conjunctiva). When the blood vessels in your conjunctiva become inflamed, your eye becomes red or pink, and it will probably feel itchy. Bacterial conjunctivitis spreads very easily from person to person (is contagious). It also spreads easily from one eye to the other eye.  What are the causes?  This condition is caused by several common bacteria. You may get the infection if you come into close contact with another person who is infected. You may also come into contact with items that are contaminated with the bacteria, such as a face towel, contact lens solution, or eye makeup.  What increases the risk?  This condition is more likely to develop in people who:  ? Are exposed to other people who have the infection.  ? Wear contact lenses.  ? Have a sinus infection.  ? Have had a recent eye injury or surgery.  ? Have a weak body defense system (immune system).  ? Have a medical condition that causes dry eyes.  What are the signs or symptoms?  Symptoms of this condition include:  ? Eye redness.  ? Tearing or watery eyes.  ? Itchy eyes.  ? Burning feeling in your eyes.  ? Thick, yellowish discharge from an eye. This may turn into a crust on the eyelid overnight and cause your eyelids to stick together.  ? Swollen eyelids.  ? Blurred vision.  How is this diagnosed?  Your health care provider can diagnose this condition based on your symptoms and medical history. Your health care provider may also take a sample of discharge from your eye to find the cause of your infection. This is rarely done.  How is this treated?  Treatment for this condition includes:  ? Antibiotic eye drops or ointment to clear the infection more quickly and prevent the spread of infection to others.  ? Oral antibiotic medicines to treat infections that do not  respond to drops or ointments, or last longer than 10 days.  ? Cool, wet cloths (cool compresses) placed on the eyes.  ? Artificial tears applied 2-6 times a day.  Follow these instructions at home:  Medicines  ? Take or apply your antibiotic medicine as told by your health care provider. Do not stop taking or applying the antibiotic even if you start to feel better.  ? Take or apply over-the-counter and prescription medicines only as told by your health care provider.  ? Be very careful to avoid touching the edge of your eyelid with the eye drop bottle or the ointment tube when you apply medicines to the affected eye. This will keep you from spreading the infection to your other eye or to other people.  Managing discomfort  ? Gently wipe away any drainage from your eye with a warm, wet washcloth or a cotton ball.  ? Apply a cool, clean washcloth to your eye for 10-20 minutes, 3-4 times a day.  General instructions  ? Do not wear contact lenses until the inflammation is gone and your health care provider says it is safe to wear them again. Ask your health care provider how to sterilize or replace your contact lenses before you use them again. Wear glasses until you can resume wearing contacts.  ? Avoid wearing eye makeup until the inflammation is gone. Throw away any old  eye cosmetics that may be contaminated.  ? Change or wash your pillowcase every day.  ? Do not share towels or washcloths. This may spread the infection.  ? Wash your hands often with soap and water. Use paper towels to dry your hands.  ? Avoid touching or rubbing your eyes.  ? Do not drive or use heavy machinery if your vision is blurred.  Contact a health care provider if:  ? You have a fever.  ? Your symptoms do not get better after 10 days.  Get help right away if:  ? You have a fever and your symptoms suddenly get worse.  ? You have severe pain when you move your eye.  ? You have facial pain, redness, or swelling.  ? You have sudden loss of  vision.  This information is not intended to replace advice given to you by your health care provider. Make sure you discuss any questions you have with your health care provider.  Document Released: 06/18/2005 Document Revised: 10/27/2015 Document Reviewed: 03/31/2015  Elsevier Interactive Patient Education ? 2019 Elsevier Inc[employer]     Patient Education     Sinusitis, Adult  Sinusitis is soreness and swelling (inflammation) of your sinuses. Sinuses are hollow spaces in the bones around your face. They are located:  ? Around your eyes.  ? In the middle of your forehead.  ? Behind your nose.  ? In your cheekbones.  Your sinuses and nasal passages are lined with a stringy fluid (mucus). Mucus normally drains out of your sinuses. Swelling can trap mucus in your sinuses. This lets germs like bacteria or viruses grow, and that leads to infection. Most of the time, sinusitis is caused by a virus.  If bacteria is causing your infection, your doctor may have you wait and see if you get better without antibiotic medicine. You may be prescribed antibiotics if you have:  ? A very bad infection.  ? A weak disease-fighting (immune) system.  Follow these instructions at home:  Medicines  ? Take, use, or apply over-the-counter and prescription medicines only as told by your doctor. These may include nasal sprays.  ? If you were prescribed an antibiotic, take it as told by your doctor. Do not stop taking the antibiotic even if you start to feel better.  Hydrate and Humidify  ? Drink enough water to keep your pee (urine) pale yellow.  ? Use a cool mist humidifier to keep the humidity level in your home above 50%.  ? Breathe in steam for 10-15 minutes, 3-4 times a day or as told by your doctor. You can do this in the bathroom while a hot shower is running.  ? Try not to spend time in cool or dry air.  Rest  ? Rest as much as possible.  ? Sleep with your head raised (elevated).  ? Make sure to get enough sleep each night.  General  instructions  ? Put a warm, moist washcloth on your face 3-4 times a day, or as often as told by your doctor. This will help with discomfort.  ? Wash your hands often with soap and water. If there is no soap and water, use hand sanitizer.  ? Do not smoke. Avoid being around people who are smoking (secondhand smoke).  ? Keep all follow-up visits as told by your doctor. This is important.  Contact a doctor if:  ? You have a fever.  ? Your symptoms get worse.  ? Your symptoms do not  get better within 10 days.  Get help right away if:  ? You have a very bad headache.  ? You cannot stop throwing up (vomiting).  ? You have pain or swelling around your face or eyes.  ? You have trouble seeing.  ? You feel confused.  ? Your neck is stiff.  ? You have trouble breathing.  This information is not intended to replace advice given to you by your health care provider. Make sure you discuss any questions you have with your health care provider.  Document Released: 12/05/2007 Document Revised: 12/28/2016 Document Reviewed: 04/13/2015  Elsevier Interactive Patient Education ? 2019 Elsevier Inc.

## 2018-01-21 NOTE — Progress Notes (Signed)
Christie Maldonado is a 22 y.o. female who presents with Sinus Problem (xFriday)  .       HISTORY OF PRESENT ILLNESS  Sinus Problem   This is a new problem. The current episode started 1 to 4 weeks ago ( ). The problem has been gradually worsening since onset. There has been no fever. Her pain is at a severity of 1/10. The pain is mild. Associated symptoms include chills, congestion, coughing, diaphoresis, headaches, sinus pressure, sneezing and a sore throat. Pertinent negatives include no ear pain, hoarse voice or shortness of breath. (Runny nose. ) Treatments tried: Afrin, tylenol sinus severe. The treatment provided mild relief.          History reviewed. No pertinent past medical history.  History reviewed. No pertinent surgical history.  History reviewed. No pertinent family history.   No Known Allergies  Social History     Socioeconomic History   . Marital status: Single     Spouse name: Not on file   . Number of children: Not on file   . Years of education: Not on file   . Highest education level: Not on file   Tobacco Use   . Smoking status: Passive Smoke Exposure - Never Smoker   . Smokeless tobacco: Never Used   Substance and Sexual Activity   . Alcohol use: Not Currently   . Drug use: Not Currently   . Sexual activity: Not on file   Other Topics Concern   . Caffeine Concern Not Asked   . Exercise Not Asked     Outpatient Medications Marked as Taking for the 01/21/18 encounter (Office Visit) with EXAM RM 113   Medication Sig Dispense Refill   . fLUoxetine (PROZAC) 20 MG capsule Take 20 mg by mouth daily.           REVIEW OF SYSTEMS  Review of Systems   Constitutional: Positive for chills and diaphoresis. Negative for fever and malaise/fatigue.   HENT: Positive for congestion, sinus pressure, sneezing and sore throat. Negative for ear pain and hoarse voice.    Eyes: Positive for discharge and redness. Negative for double vision.   Respiratory: Positive for cough. Negative for shortness of breath and wheezing.     Cardiovascular: Negative for chest pain and palpitations.   Gastrointestinal: Negative for abdominal pain, nausea and vomiting.   Genitourinary: Negative for dysuria, frequency and urgency.   Skin: Negative for itching and rash.   Neurological: Positive for headaches. Negative for dizziness.       PHYSICAL EXAM  BP 116/78 (BP Location: Right arm, Patient Position: Sitting)   Pulse 73   Temp 36.8 ?C (98.2 ?F) (Oral)   Resp 12   Ht 5' 5 (1.651 m)   Wt 149 lb (67.6 kg)   SpO2 99%   BMI 24.79 kg/m?   Body mass index is 24.79 kg/m?Marland Kitchen    Physical Exam   Constitutional: She is oriented to person, place, and time.   HENT:   Head: Normocephalic and atraumatic.   Right Ear: External ear normal.   Left Ear: External ear normal.   Nose: Mucosal edema and sinus tenderness present. Right sinus exhibits maxillary sinus tenderness and frontal sinus tenderness. Left sinus exhibits maxillary sinus tenderness and frontal sinus tenderness.   Mouth/Throat: Oropharynx is clear and moist.   Eyes: Pupils are equal, round, and reactive to light. EOM and lids are normal. Lids are everted and swept, no foreign bodies found. Right eye exhibits discharge. Left eye exhibits  discharge.   Neck: Normal range of motion. Neck supple.   Cardiovascular: Normal rate and regular rhythm.   Pulmonary/Chest: Effort normal and breath sounds normal.   Musculoskeletal: Normal range of motion.   Neurological: She is alert and oriented to person, place, and time.   Skin: Skin is warm and dry.   Psychiatric: Judgment normal.       DIAGNOSTIC STUDIES       ASSESSMENT & PLAN       ICD-10-CM    1. Sinusitis, unspecified chronicity, unspecified location J32.9 amoxicillin (AMOXIL) 500 MG capsule     sulfacetamide (BLEPH-10) 10 % ophthalmic solution   2. BMI 24.0-24.9, adult Z68.24    3. Passive smoke exposure Z77.22    4. Conjunctivitis, unspecified conjunctivitis type, unspecified laterality H10.9 amoxicillin (AMOXIL) 500 MG capsule     sulfacetamide  (BLEPH-10) 10 % ophthalmic solution     This is a new problem to me.  Current medications, allergies and problem list personally reviewed in Epic today.  Medications as prescribed prompt return to clinic if not improving or for any worsening.      Follow-Up  Data Unavailable  Data Unavailable  As needed, Recheck for current diagnosis       Discharge Instructions      This note was transcribed using speech recognition software. Please contact us for clarification if any questions arise relating to the wording of this document.

## 2018-01-22 MED ORDER — sulfacetamide (BLEPH-10) 10 % ophthalmic solution
10 | OPHTHALMIC | 0 refills | Status: AC
Start: 2018-01-22 — End: 2018-01-31

## 2018-01-22 MED ORDER — amoxicillin (AMOXIL) 500 MG capsule
500 | ORAL_CAPSULE | Freq: Three times a day (TID) | ORAL | 0 refills | Status: AC
Start: 2018-01-22 — End: 2018-01-31

## 2018-01-23 NOTE — Telephone Encounter (Signed)
Attempted Care call. Left voicemail encouraging call back with any questions or concerns to [phone].     Otherwise patient can disregard message if they do not have any questions or concerns

## 2020-04-04 ENCOUNTER — Telehealth (INDEPENDENT_AMBULATORY_CARE_PROVIDER_SITE_OTHER): Payer: Self-pay | Admitting: Obstetrics & Gynecology

## 2020-04-04 NOTE — Telephone Encounter (Signed)
Called left message to call back. Need to obtain prior records [place]

## 2020-04-08 NOTE — Telephone Encounter (Signed)
Called left message to call back. Need to obtain prior records [place]

## 2020-04-14 NOTE — Telephone Encounter (Signed)
Called left message to call back. Need to obtain prior records [place]

## 2020-05-09 NOTE — Telephone Encounter (Signed)
Called and left message. Unable to verify insurance through [place]. Asked patient to call back and provide insurance info.

## 2020-05-10 NOTE — Telephone Encounter (Signed)
Was able to find patient's correct insurance info. Still need prior records [place]

## 2020-05-16 ENCOUNTER — Other Ambulatory Visit
Admission: RE | Admit: 2020-05-16 | Discharge: 2020-05-16 | Disposition: A | Discharge status: Home / Self Care | Payer: PRIVATE HEALTH INSURANCE | Source: Ambulatory Visit | Attending: Obstetrics & Gynecology | Admitting: Obstetrics & Gynecology

## 2020-05-16 ENCOUNTER — Encounter (INDEPENDENT_AMBULATORY_CARE_PROVIDER_SITE_OTHER): Payer: Self-pay | Admitting: Obstetrics & Gynecology

## 2020-05-16 ENCOUNTER — Ambulatory Visit: Payer: PRIVATE HEALTH INSURANCE | Admitting: Obstetrics & Gynecology

## 2020-05-16 ENCOUNTER — Ambulatory Visit (INDEPENDENT_AMBULATORY_CARE_PROVIDER_SITE_OTHER): Payer: PRIVATE HEALTH INSURANCE | Admitting: Obstetrics & Gynecology

## 2020-05-16 VITALS — BP 112/68 | HR 93 | Ht 65.0 in | Wt 154.6 lb

## 2020-05-16 DIAGNOSIS — Z8489 Family history of other specified conditions: Secondary | ICD-10-CM

## 2020-05-16 DIAGNOSIS — Z1151 Encounter for screening for human papillomavirus (HPV): Secondary | ICD-10-CM | POA: Insufficient documentation

## 2020-05-16 DIAGNOSIS — Z01411 Encounter for gynecological examination (general) (routine) with abnormal findings: Secondary | ICD-10-CM

## 2020-05-16 DIAGNOSIS — Z124 Encounter for screening for malignant neoplasm of cervix: Secondary | ICD-10-CM

## 2020-05-16 DIAGNOSIS — Z01419 Encounter for gynecological examination (general) (routine) without abnormal findings: Secondary | ICD-10-CM

## 2020-05-16 DIAGNOSIS — Z23 Encounter for immunization: Secondary | ICD-10-CM

## 2020-05-16 MED ORDER — HPV 9-VALENT RECOMB VACCINE IM SUSY
0.5000 mL | PREFILLED_SYRINGE | Freq: Once | INTRAMUSCULAR | Status: AC
Start: 2020-05-16 — End: 2020-05-16
  Administered 2020-05-16: 0.5 mL via INTRAMUSCULAR
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
## 2020-05-16 NOTE — Progress Notes (Signed)
Vaccine Screening Questions    1. Are you allergic to Latex? NO    2.  Have you had a serious reaction or an allergic reaction to a vaccine?  NO    3.  Currently have a moderate or severe illness, including fever?  NO    If NO to all questions above - Patient may receive vaccine.    Patient reviewed VIS, all questions were answered, and verbal informed consent obtained for the human papillomavirus 9-valent vaccine (Gardasil-9) syringe 0.5 mL   vaccine? YES    Vaccine given today without initial adverse effect.    Sierah Lacewell M Jissell Trafton

## 2020-05-18 LAB — HPV REFLEX TO PAP
HPV 16 Genotype: NEGATIVE
HPV 18 Genotype: NEGATIVE
High Risk HPV Screening: NEGATIVE
Other High Risk HPV Genotype: NEGATIVE

## 2021-06-27 ENCOUNTER — Other Ambulatory Visit (HOSPITAL_COMMUNITY): Payer: Self-pay | Attending: Psychiatry

## 2021-06-27 ENCOUNTER — Telehealth (HOSPITAL_COMMUNITY): Payer: Self-pay | Admitting: Professional

## 2021-06-27 ENCOUNTER — Other Ambulatory Visit: Payer: Self-pay

## 2024-01-17 ENCOUNTER — Other Ambulatory Visit: Payer: Self-pay | Admitting: Medical Genetics

## 2024-01-18 ENCOUNTER — Other Ambulatory Visit: Payer: Self-pay

## 2024-01-22 ENCOUNTER — Other Ambulatory Visit

## 2024-04-29 ENCOUNTER — Other Ambulatory Visit: Payer: Self-pay | Admitting: Medical Genetics

## 2024-04-29 DIAGNOSIS — Z006 Encounter for examination for normal comparison and control in clinical research program: Secondary | ICD-10-CM

## 2024-05-29 LAB — GENECONNECT MOLECULAR SCREEN: Genetic Analysis Overall Interpretation: NEGATIVE
